# Patient Record
Sex: Female | Born: 1970 | Race: White | Hispanic: No | Marital: Single | Smoking: Never smoker
Health system: Southern US, Academic
[De-identification: ages and names within clinical notes are randomized; demographics above are authoritative.]

## PROBLEM LIST (undated history)

## (undated) DIAGNOSIS — H9319 Tinnitus, unspecified ear: Secondary | ICD-10-CM

## (undated) DIAGNOSIS — I10 Essential (primary) hypertension: Secondary | ICD-10-CM

## (undated) HISTORY — DX: Tinnitus, unspecified ear: H93.19

## (undated) HISTORY — DX: Essential (primary) hypertension: I10

---

## 2019-02-09 DIAGNOSIS — Z8669 Personal history of other diseases of the nervous system and sense organs: Secondary | ICD-10-CM | POA: Insufficient documentation

## 2019-02-09 DIAGNOSIS — R569 Unspecified convulsions: Secondary | ICD-10-CM | POA: Insufficient documentation

## 2020-01-18 DIAGNOSIS — E872 Acidosis, unspecified: Secondary | ICD-10-CM | POA: Insufficient documentation

## 2020-01-18 DIAGNOSIS — R9089 Other abnormal findings on diagnostic imaging of central nervous system: Secondary | ICD-10-CM | POA: Insufficient documentation

## 2020-01-18 DIAGNOSIS — G40209 Localization-related (focal) (partial) symptomatic epilepsy and epileptic syndromes with complex partial seizures, not intractable, without status epilepticus: Secondary | ICD-10-CM | POA: Insufficient documentation

## 2020-01-18 DIAGNOSIS — I671 Cerebral aneurysm, nonruptured: Secondary | ICD-10-CM | POA: Insufficient documentation

## 2020-01-18 DIAGNOSIS — E878 Other disorders of electrolyte and fluid balance, not elsewhere classified: Secondary | ICD-10-CM | POA: Insufficient documentation

## 2020-01-18 DIAGNOSIS — Z8679 Personal history of other diseases of the circulatory system: Secondary | ICD-10-CM | POA: Insufficient documentation

## 2020-05-05 DIAGNOSIS — I429 Cardiomyopathy, unspecified: Secondary | ICD-10-CM | POA: Insufficient documentation

## 2021-11-18 IMAGING — MR MRA HEAD WITHOUT CONTRAST
7 of 8 series · 27 of 48 positions shown · IV contrast (gadolinium)
Comparison: None available.

﻿EXAM:  30644   MRA HEAD WITHOUT CONTRAST
INDICATION: Seizures.
TECHNIQUE: MR angiogram of the circle-of-Willis was performed without gadolinium contrast utilizing 3D time-of-flight technique.

[Series 5: DWI · axial · 5.0mm · 1.35mm/px · z∈[-42,+84]mm · 9 of 88 slices shown (1 of 3)]
[im 1/88]
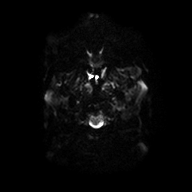
[im 11/88]
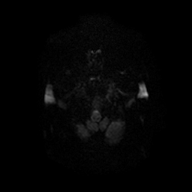
[im 22/88]
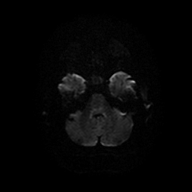
[im 33/88]
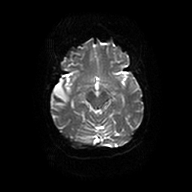
[im 44/88]
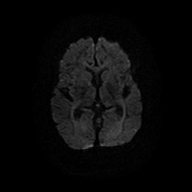
[im 55/88]
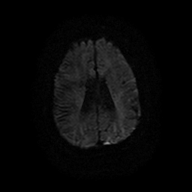
[im 66/88]
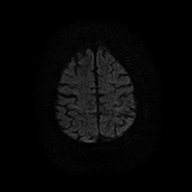
[im 77/88]
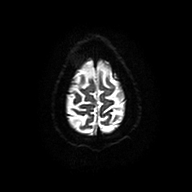
[im 88/88]
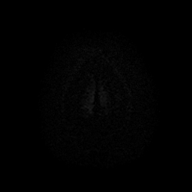

[Series 6: DWI · axial · 5.0mm · 1.35mm/px · z∈[-42,+84]mm · 3 of 22 slices shown (2 of 3)]
[im 1/22]
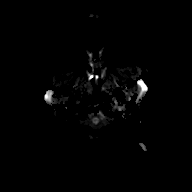
[im 11/22]
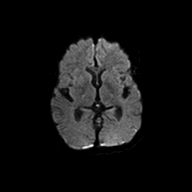
[im 22/22]
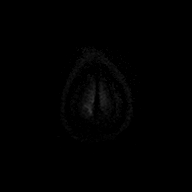

[Series 7: DWI · axial · 5.0mm · 1.35mm/px · z∈[-42,+84]mm · 3 of 22 slices shown (3 of 3)]
[im 1/22]
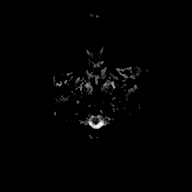
[im 11/22]
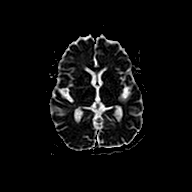
[im 22/22]
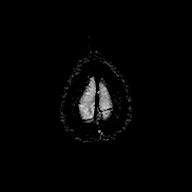

[Series 8: FLAIR · sagittal · 4.0mm · 0.75mm/px · 3 of 26 slices shown (1 of 2)]
[im 1/26]
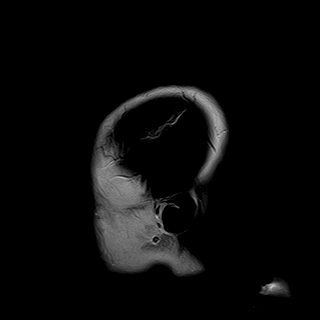
[im 13/26]
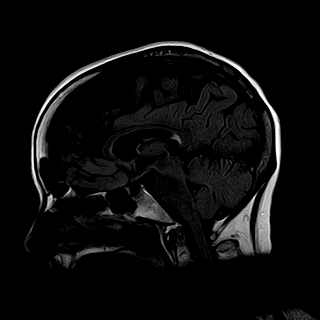
[im 26/26]
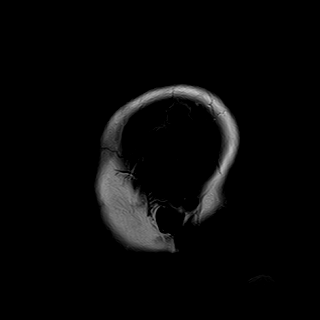

[Series 9: T2 · axial · 5.0mm · 0.43mm/px · z∈[-52,+92]mm · 3 of 25 slices shown]
[im 1/25]
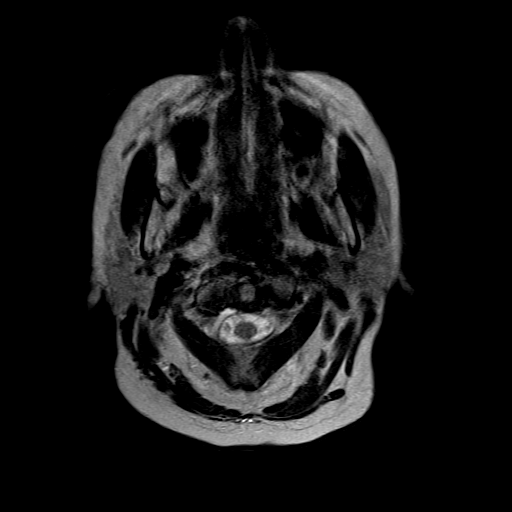
[im 13/25]
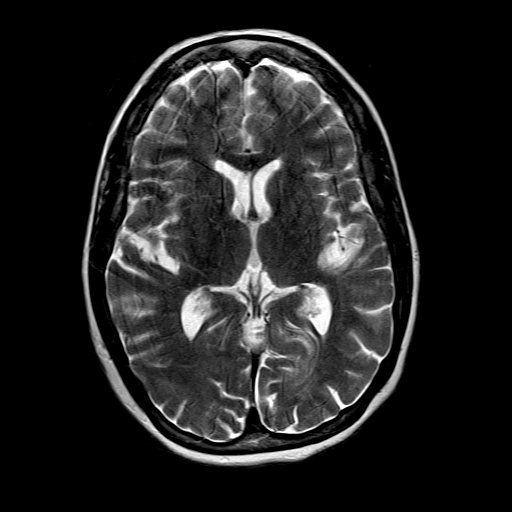
[im 25/25]
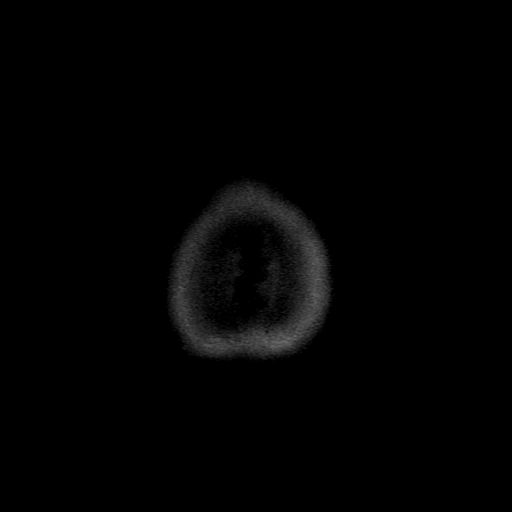

[Series 10: FLAIR · axial · 5.0mm · 0.43mm/px · z∈[-52,+92]mm · 3 of 25 slices shown (2 of 2)]
[im 1/25]
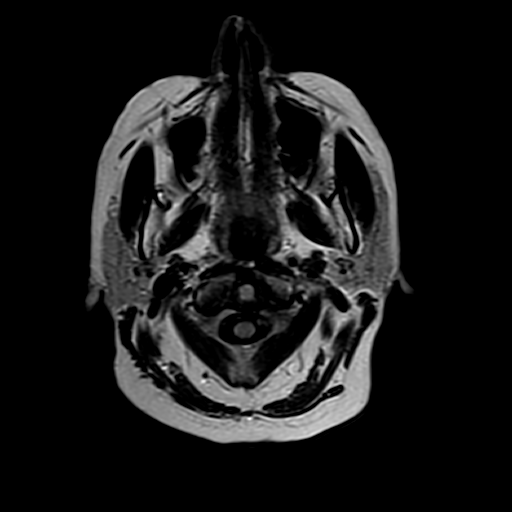
[im 13/25]
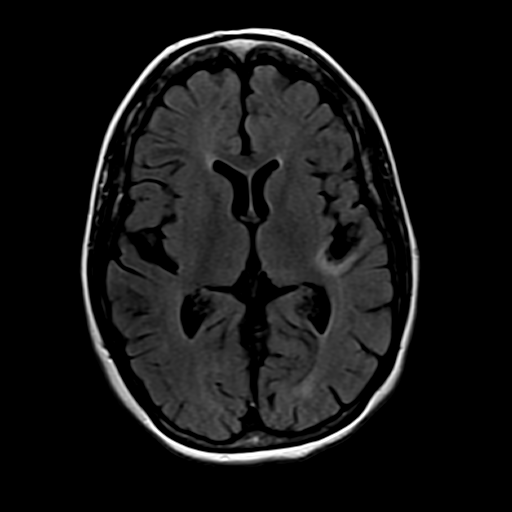
[im 25/25]
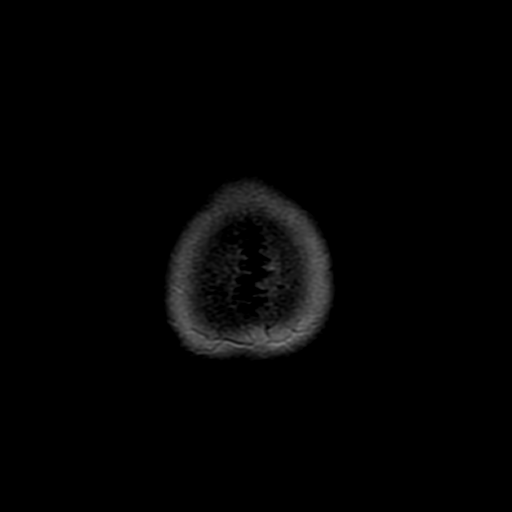

[Series 11: T1 · axial · 5.0mm · 0.43mm/px · z∈[-52,+92]mm · 3 of 25 slices shown]
[im 1/25]
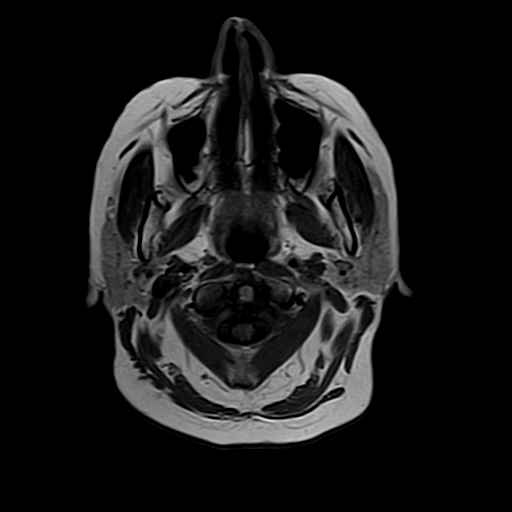
[im 13/25]
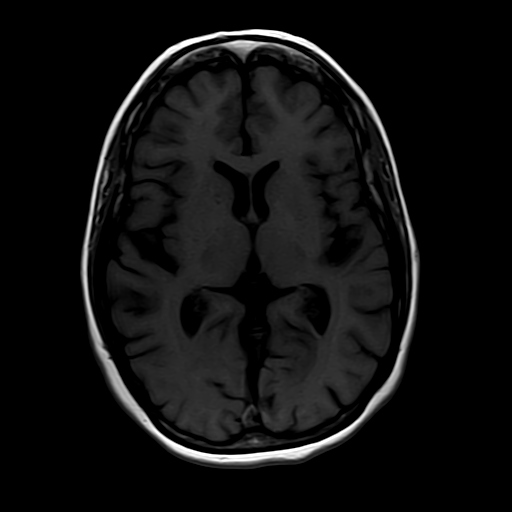
[im 25/25]
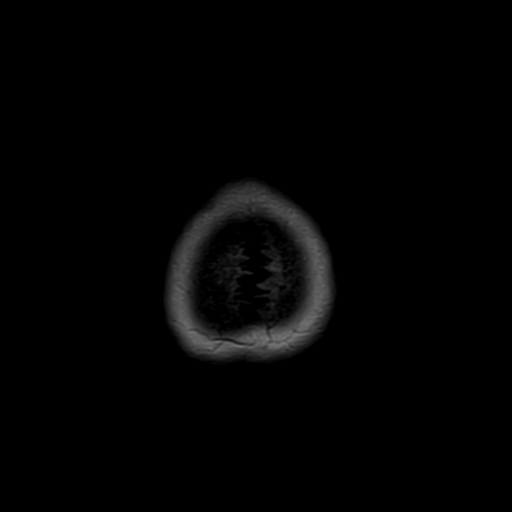

[27 of 48 positions shown; findings below may reference images not displayed]

FINDINGS: Major arteries of the circle-of-Willis are patent without hemodynamically significant stenosis, occlusion or aneurysm at any level.
IMPRESSION: Unremarkable exam.

## 2021-11-18 IMAGING — MR MRI BRAIN W/O CONTRAST
4 of 7 series · 24 of 48 positions shown · IV contrast (gadolinium)
Comparison: None available.

﻿EXAM:  MRI BRAIN W/O CONTRAST
INDICATION: Seizures.
TECHNIQUE: Multiplanar, multisequential MRI of the brain was performed without gadolinium contrast.

[Series 5: FLAIR · sagittal · 4.0mm · 0.75mm/px · 8 of 26 slices shown (1 of 2)]
[im 1/26]
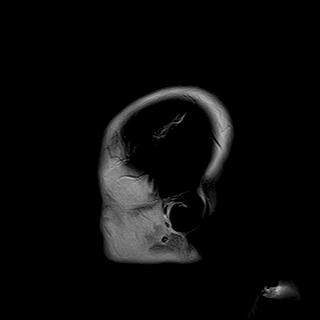
[im 4/26]
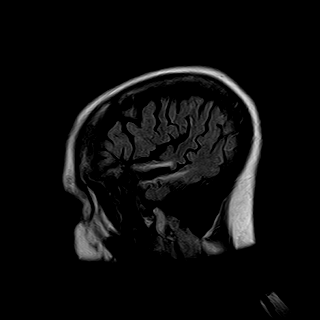
[im 8/26]
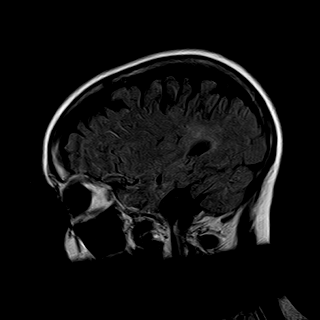
[im 11/26]
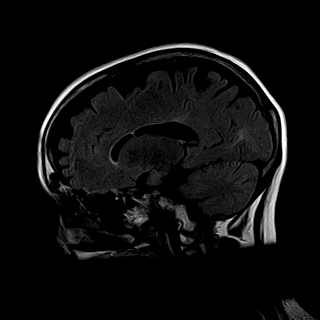
[im 15/26]
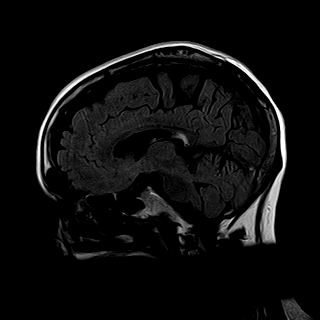
[im 18/26]
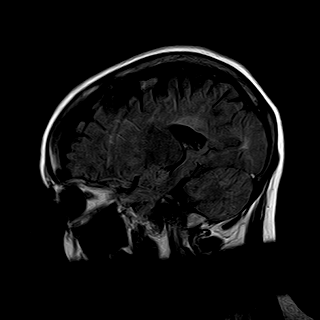
[im 22/26]
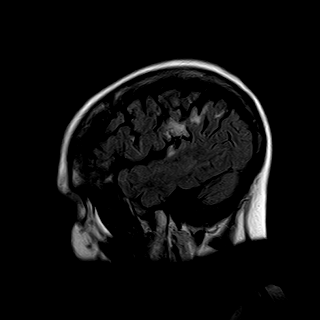
[im 26/26]
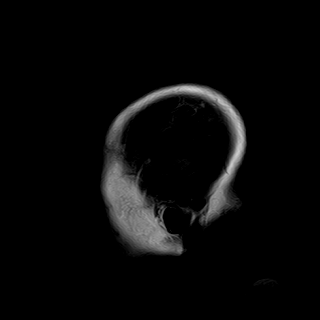

[Series 6: T2 · axial · 5.0mm · 0.43mm/px · z∈[-50,+70]mm · 6 of 25 slices shown]
[im 1/25]
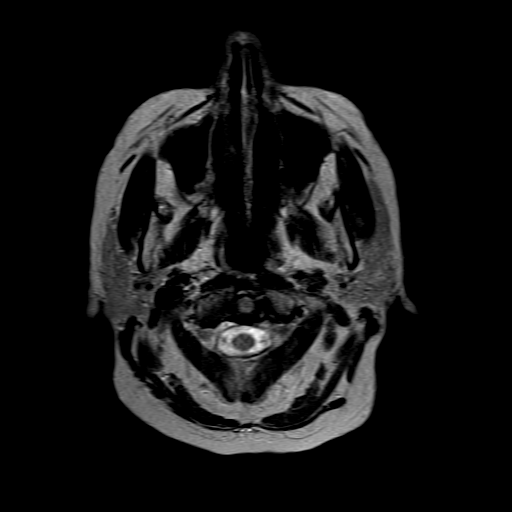
[im 5/25]
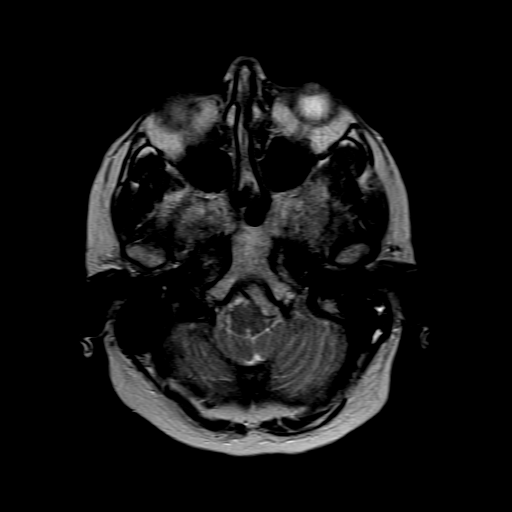
[im 9/25]
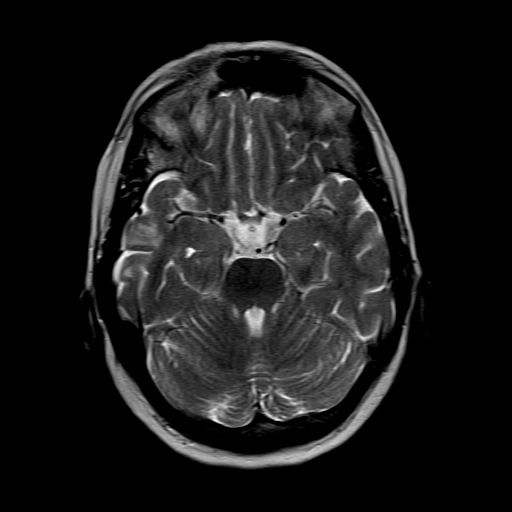
[im 13/25]
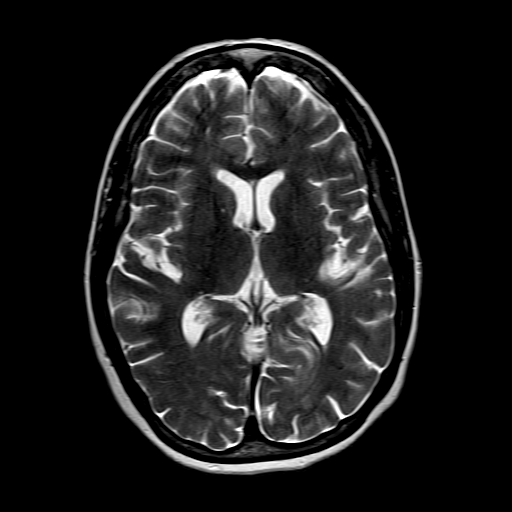
[im 17/25]
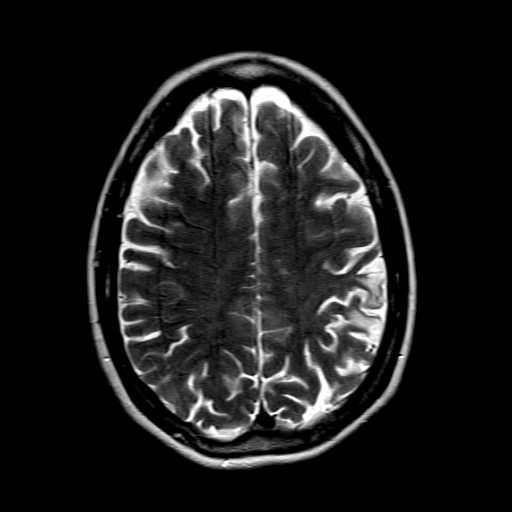
[im 21/25]
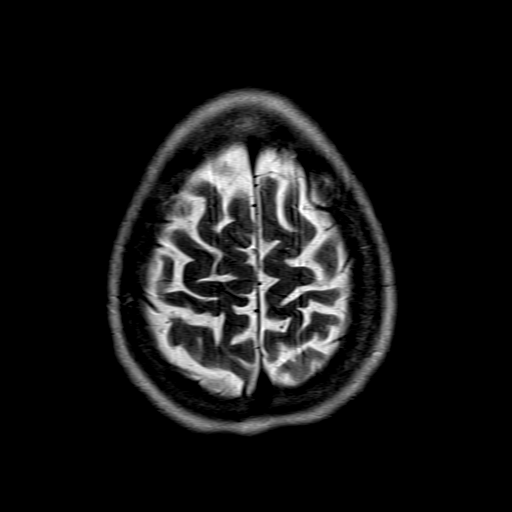

[Series 7: FLAIR · axial · 5.0mm · 0.43mm/px · z∈[-50,+94]mm · 7 of 25 slices shown (2 of 2)]
[im 1/25]
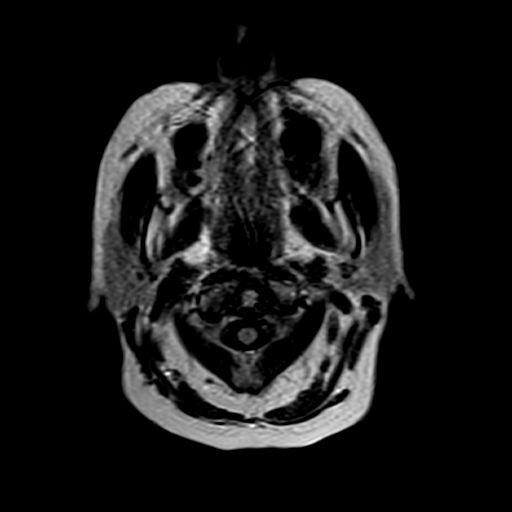
[im 5/25]
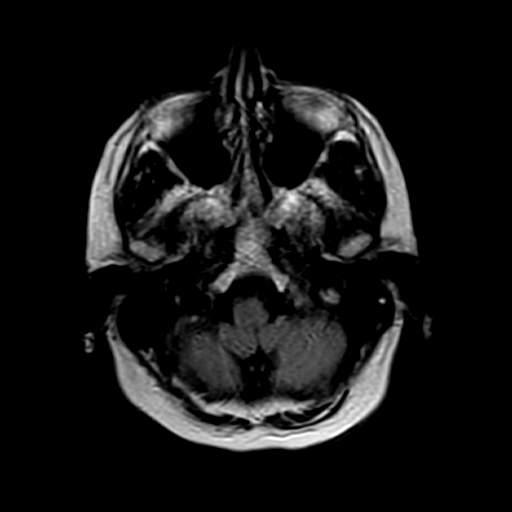
[im 9/25]
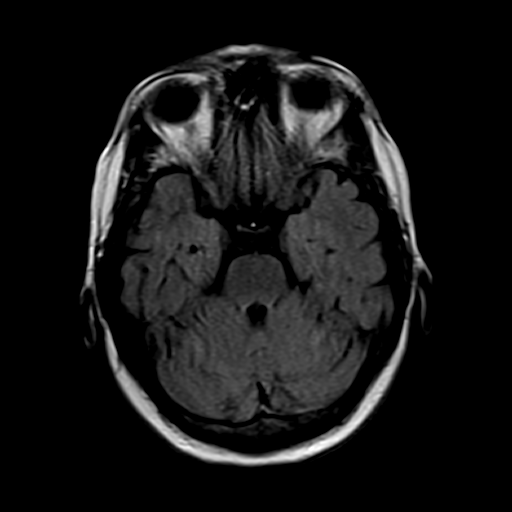
[im 13/25]
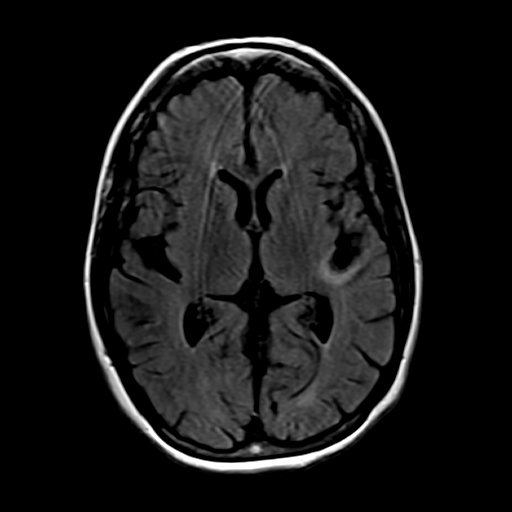
[im 17/25]
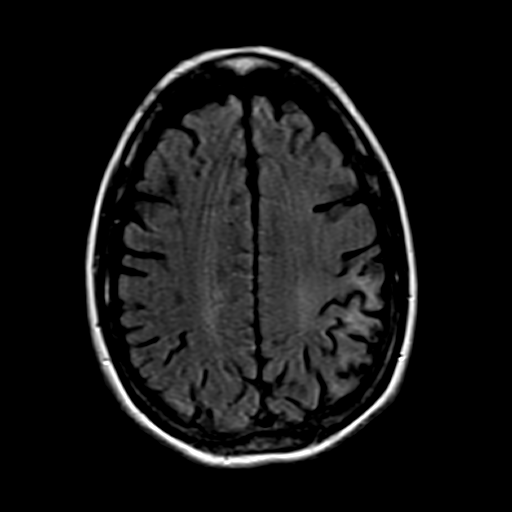
[im 21/25]
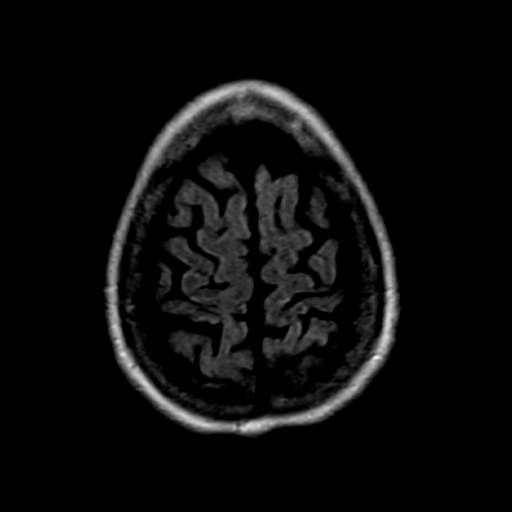
[im 25/25]
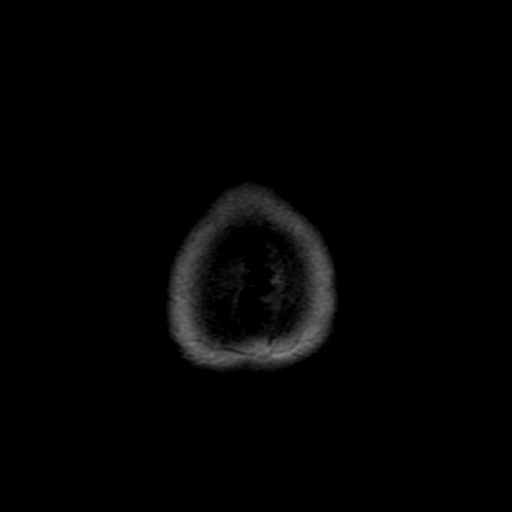

[Series 8: T1 · axial · 5.0mm · 0.43mm/px · z∈[-26,+70]mm · 3 of 25 slices shown]
[im 5/25]
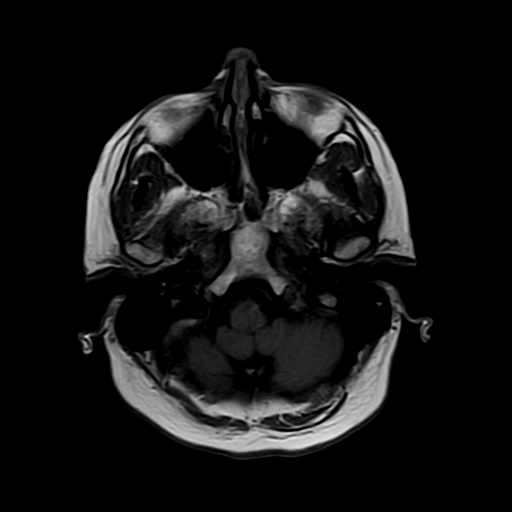
[im 13/25]
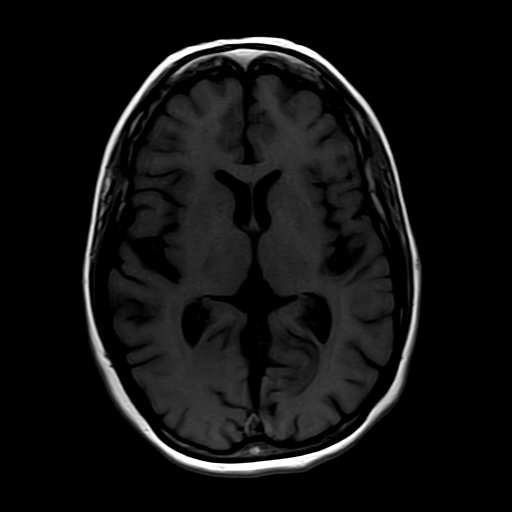
[im 21/25]
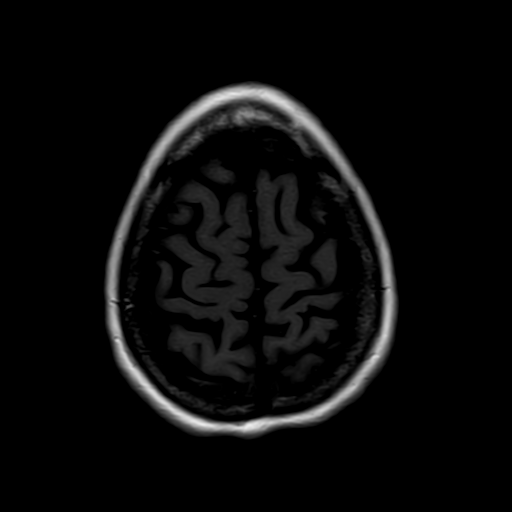

[24 of 48 positions shown; findings below may reference images not displayed]

FINDINGS: Ventricular and sulcal size is normal for the patient's age. There are small chronic bilateral temporal lobe infarcts. There is no mass effect, midline shift or intracranial hemorrhage. There is no evidence of acute infarction or prior microhemorrhages. Skull base flow voids and basal cisterns are patent. Sagittal survey of midline structures is unremarkable. There are no extra-axial fluid collections. There is partial opacification of the left mastoid air cells. Paranasal sinuses and orbital contents are unremarkable.
IMPRESSION: Small chronic bilateral temporal lobe infarcts. No acute intracranial abnormality.

## 2022-04-30 DIAGNOSIS — Z8679 Personal history of other diseases of the circulatory system: Secondary | ICD-10-CM | POA: Insufficient documentation

## 2022-05-20 IMAGING — CT CTA HEAD
2 of 3 series · 13 of 33 positions shown · IV contrast (CONTRAST)
Comparison: MR angiogram of the head dated 11/17/2021.

﻿EXAM:  CTA HEAD,CTA NECK
INDICATION: Chronic headaches.
TECHNIQUE: CT angiogram of the head and neck was performed following intravenous administration of 100 mL of Omnipaque 350. Additional volume rendered images were also obtained. Images were reviewed in multiple windows and projections. Exam was performed using 1 or more of the following dose reduction techniques: Automated exposure control, adjustment of the mA and/or kV according to patient size, or the use of iterative reconstruction technique.

[Series 8048: 1.5 head · axial · 0.49mm/px · z∈[+140,+248]mm · 12 of 86 slices shown]
[im 7/86  soft-tissue]
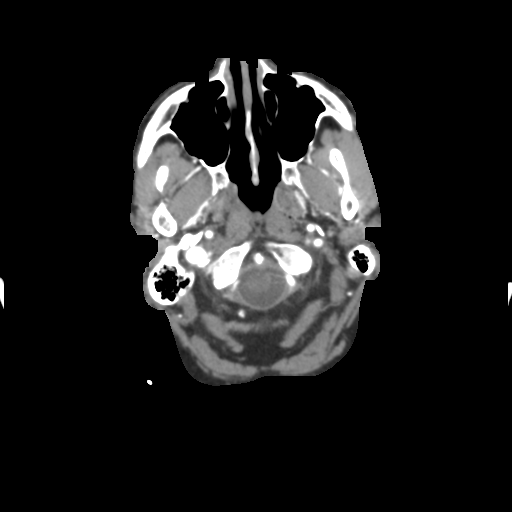
[im 14/86  bone]
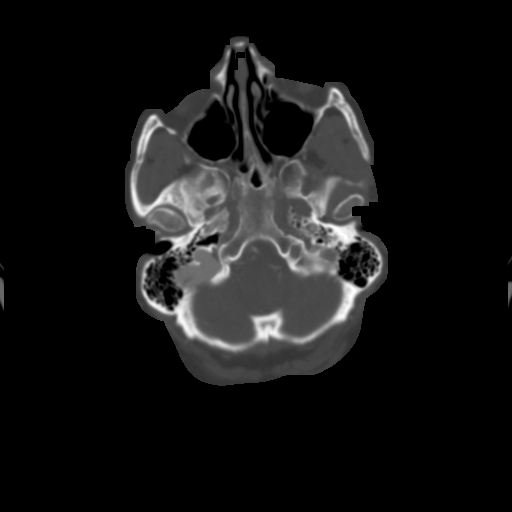
[im 20/86  soft-tissue]
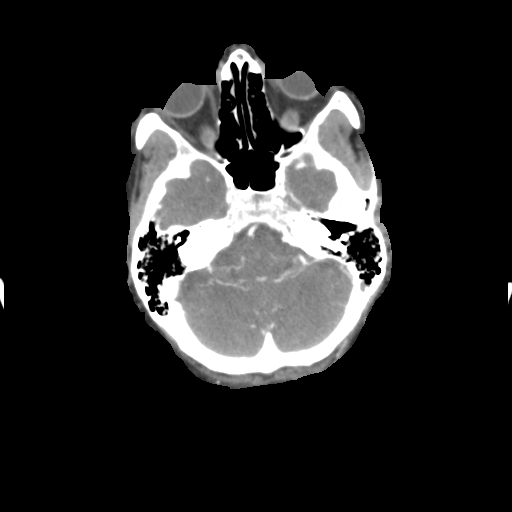
[im 27/86  bone]
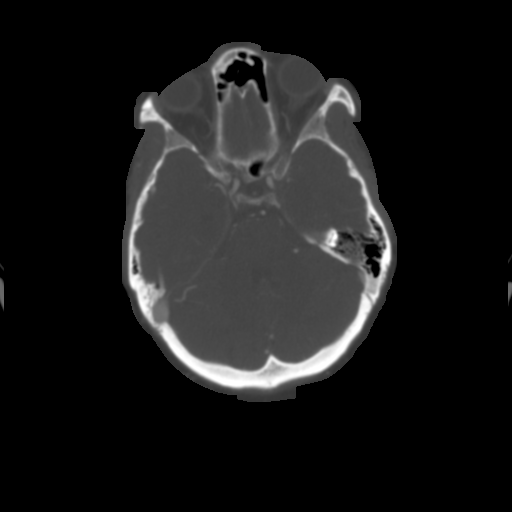
[im 33/86  soft-tissue]
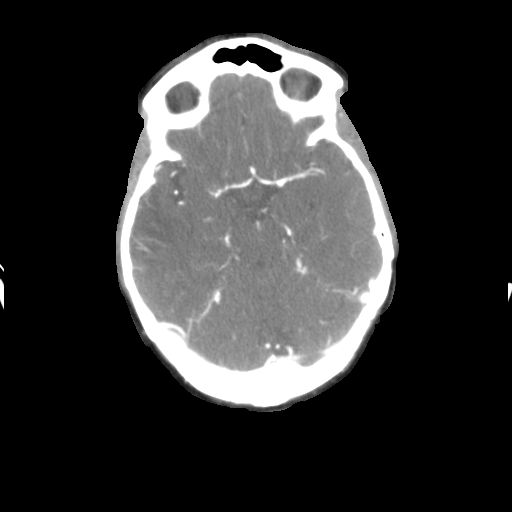
[im 40/86  bone]
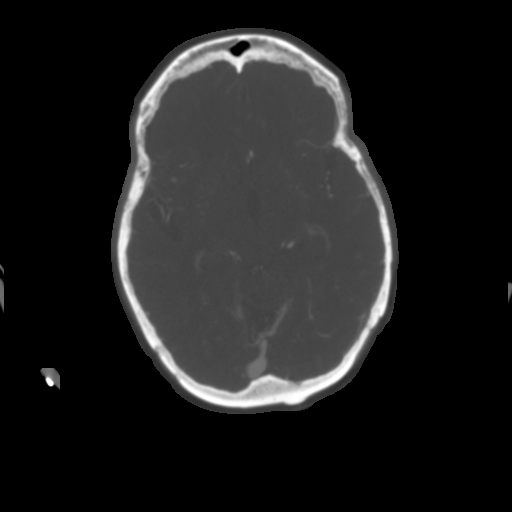
[im 46/86  soft-tissue]
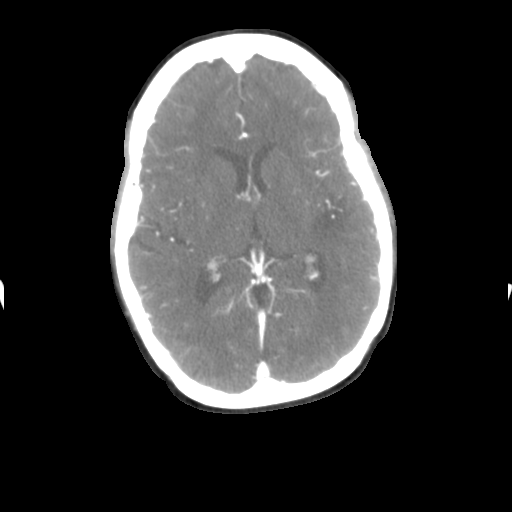
[im 53/86  bone]
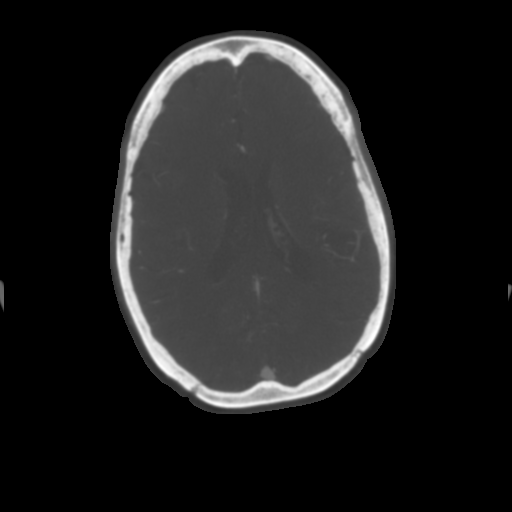
[im 59/86  soft-tissue]
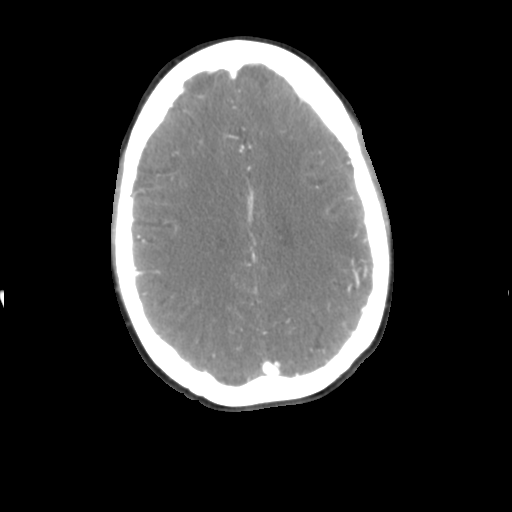
[im 66/86  bone]
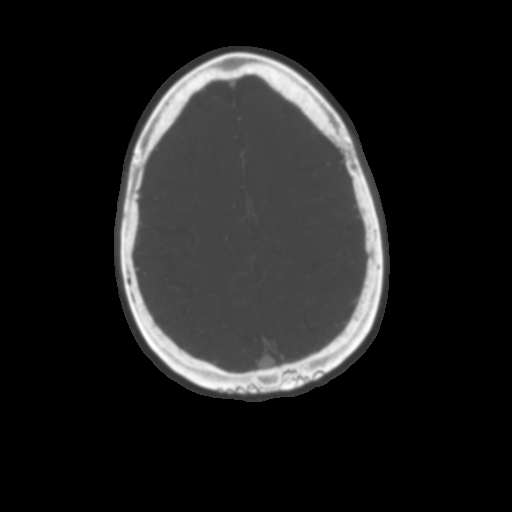
[im 72/86  soft-tissue]
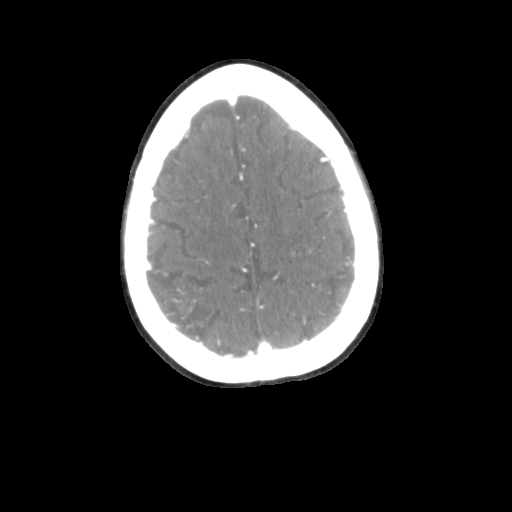
[im 79/86  bone]
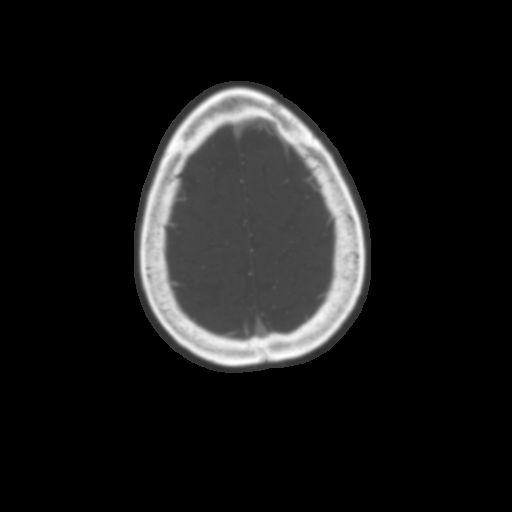

[sag · sagittal · 0.49mm/px · 1 of 76 slices shown]
[im 38/76  soft-tissue]
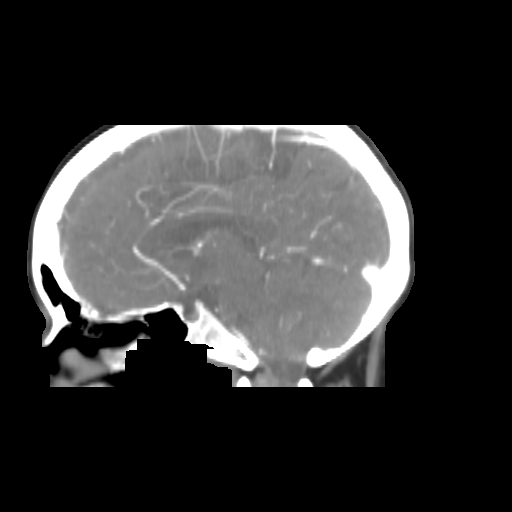

[13 of 33 positions shown; findings below may reference images not displayed]

FINDINGS: Neck 

There is a small amount of calcified atherosclerotic plaque within the carotid bulbs bilaterally.  No hemodynamically significant stenosis, occlusion or dissection is seen at any level within the carotid and vertebral arteries throughout the neck.  Right vertebral artery is congenitally diminutive in caliber.

Head

Major arteries of the circle-of-Willis are also patent without significant atherosclerotic disease, hemodynamically significant stenosis, occlusion or aneurysm at any level.
IMPRESSION: Patent major arteries of the head and neck.

## 2022-07-27 ENCOUNTER — Other Ambulatory Visit: Payer: Self-pay

## 2022-07-27 ENCOUNTER — Ambulatory Visit (INDEPENDENT_AMBULATORY_CARE_PROVIDER_SITE_OTHER): Payer: Medicare HMO | Admitting: OTOLARYNGOLOGY

## 2022-07-27 ENCOUNTER — Encounter (INDEPENDENT_AMBULATORY_CARE_PROVIDER_SITE_OTHER): Payer: Self-pay | Admitting: OTOLARYNGOLOGY

## 2022-07-27 VITALS — Ht 60.0 in | Wt 120.0 lb

## 2022-07-27 DIAGNOSIS — H9313 Tinnitus, bilateral: Secondary | ICD-10-CM

## 2022-07-27 DIAGNOSIS — H9202 Otalgia, left ear: Secondary | ICD-10-CM

## 2022-07-27 DIAGNOSIS — J342 Deviated nasal septum: Secondary | ICD-10-CM

## 2022-07-27 DIAGNOSIS — H9190 Unspecified hearing loss, unspecified ear: Secondary | ICD-10-CM

## 2022-07-27 NOTE — Procedures (Signed)
[facility], [facility]  [address]  [place] [address]    Procedure Note    Name: Angela Osborne MRN:  [id number]   Date: 07/27/2022 Age: 51 y.o.       [address]31 - NASAL ENDOSCOPY DIAGNOSTIC UNILATERAL OR BILATERAL (AMB ONLY)  Performed by: Conchita Paris, DO  Authorized by: Conchita Paris, DO     Time Out:     Immediately before the procedure, a time out was called:  Yes    Patient verified:  Yes    Procedure Verified:  Yes    Site Verified:  Yes  Documentation:      Indications for procedure: Otalgia    Anesthesia: Oxymetazoline nasal spray    Description: Nasal endoscopy with rigid scope was performed with examination of the  septum, inferior, middle, and superior meatus, turbinates, sphenoethmoidal recess, and nasopharynx.     There were no polyps, pus, or granulation tissue noted.  ET orifices and nasopharynx were normal.     Findings: Septal deviation, no masses or lesion    The patient tolerated the procedure well.             Conchita Paris, DO

## 2022-07-27 NOTE — H&P (Signed)
[facility], [facility]  [address]  [place] [address]  Phone: [phone]  Fax: [phone]      Encounter Date: 07/27/2022    Patient ID: Angela Osborne  MRN: [id number]    DOB: 01-29-1971  Age: 51 y.o. female        Referring Provider:    Dorinda Hill, MD  [address]  [address] [place],  [place] [address]    Reason for Visit:   Chief Complaint   Patient presents with    Ringing in Ear     Complains of tinnitus for the past several years. States it has gotten worse.        History of Present Illness:  Angela Osborne is a 51 y.o. female who is referred for eval of ears.   She c/o tinnitus, left side sounds like cicadas and right sounds electrical, x 3+ years. Endorses loud noise exposure. Denies h/o ear infections, vertigo.  She c/o gradual hearing loss and interm left otalgia and otorrhea.. She has had Brain imaging d/t 6 year h/o seizure d/o, h/o stroke and brain aneurysm-- sees Dr. Andee Poles.   Tympanogram: AU Type A      Patient History:  Patient Active Problem List   Diagnosis    Brain aneurysm    Cardiomyopathy (CMS HCC)    Electrolyte disorder    History of cerebral aneurysm    History of hypertension    History of seizure disorder    Magnetic resonance imaging of brain abnormal    Metabolic acidosis    Non-intractable psychomotor epilepsy (CMS HCC)    Seizure-like activity (CMS HCC)     Current Outpatient Medications   Medication Sig    aspirin 81 mg Oral Capsule Every one hour    lacosamide (VIMPAT) 50 mg Oral Tablet TAKE 1 TABLET by mouth TWICE DAILY FOR ONE WEEK ,then take 2 tabs in the morning and 1 tab in the evening for 1 week, then fill 100mg  prescription    lisinopriL (PRINIVIL) 5 mg Oral Tablet lisinopril 5 mg tablet    zonisamide (ZONEGRAN) 100 mg Oral Capsule Take 1 Capsule (100 mg total) by mouth Twice daily     No Known Allergies  Past Medical History:   Diagnosis Date    Essential hypertension     Tinnitus       History reviewed. No pertinent surgical history.   Family Medical  History:    None         Social History     Tobacco Use    Smoking status: Never    Smokeless tobacco: Never       Review of Systems:  Review of Systems    Physical Exam:  Ht 1.524 m (5')   Wt 54.4 kg (120 lb)   BMI 23.44 kg/m       Physical Exam  Constitutional:       Appearance: Normal appearance. She is well-developed, well-groomed and normal weight.   HENT:      Head: Normocephalic and atraumatic.      Right Ear: Hearing, tympanic membrane, ear canal and external ear normal.      Left Ear: Hearing, tympanic membrane, ear canal and external ear normal.      Nose: Septal deviation and mucosal edema present.      Right Turbinates: Enlarged.      Left Turbinates: Enlarged.      Mouth/Throat:      Lips: Pink.  Mouth: Mucous membranes are moist.      Pharynx: Oropharynx is clear. Uvula midline.      Tonsils: 0 on the right. 0 on the left.      Comments: Large torus mandibulari bilat  Eyes:      Extraocular Movements: Extraocular movements intact.   Neck:      Trachea: Phonation normal.   Pulmonary:      Effort: Pulmonary effort is normal.   Musculoskeletal:      Cervical back: Normal range of motion and neck supple.   Lymphadenopathy:      Cervical: No cervical adenopathy.   Skin:     General: Skin is warm.   Neurological:      Mental Status: She is alert and oriented to person, place, and time.      Cranial Nerves: Cranial nerves 2-12 are intact. No facial asymmetry.   Psychiatric:         Attention and Perception: Attention normal.         Mood and Affect: Mood normal.         Speech: Speech normal.         Behavior: Behavior normal. Behavior is cooperative.       Assessment:  ENCOUNTER DIAGNOSES     ICD-10-CM   1. Tinnitus of both ears  H93.13   2. Hearing loss, unspecified hearing loss type, unspecified laterality  H91.90   3. Otalgia of left ear  H92.02       Plan:  Medical records reviewed on 07/27/2022.  Will order audiogram.   Orders Placed This Encounter    [phone] - NASAL ENDOSCOPY DIAGNOSTIC UNILATERAL  OR BILATERAL (AMB ONLY)    AMB PRN REFERRAL EXTERNAL AUDIOLOGIST    POCT HEARING/VISION/TYMPANOGRAM (AMB ONLY)     Return for Follow up after audiogram.    Marcelline Deist, PA-C  07/27/2022, 08:56   The advanced practice clinician's documentation was reviewed/amended in its entirety with the assessment and plan portion completely performed independently by me during this separate encounter.

## 2022-09-28 ENCOUNTER — Encounter (INDEPENDENT_AMBULATORY_CARE_PROVIDER_SITE_OTHER): Payer: Self-pay | Admitting: OTOLARYNGOLOGY

## 2022-09-28 ENCOUNTER — Other Ambulatory Visit: Payer: Self-pay

## 2022-09-28 ENCOUNTER — Ambulatory Visit (INDEPENDENT_AMBULATORY_CARE_PROVIDER_SITE_OTHER): Payer: Medicare HMO | Admitting: OTOLARYNGOLOGY

## 2022-09-28 VITALS — Ht 60.0 in | Wt 120.0 lb

## 2022-09-28 DIAGNOSIS — H9202 Otalgia, left ear: Secondary | ICD-10-CM

## 2022-09-28 DIAGNOSIS — H9190 Unspecified hearing loss, unspecified ear: Secondary | ICD-10-CM

## 2022-09-28 DIAGNOSIS — H9313 Tinnitus, bilateral: Secondary | ICD-10-CM

## 2022-09-28 NOTE — H&P (Signed)
[facility] [place]    Progress Note    Name: Angela Osborne MRN:  [id number]   Date: 09/28/2022 Age: 51 y.o.          Follow Up      Subjective:   Chief Complaint:   Follow-up After Testing (Rc after audio)       History of Present Illness:  Angela Osborne is a 51 y.o. old female who presents to the clinic for follow-up after audiogram.  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  She is still having tinnitus.  She is using some masking.    Review of Systems     Physical Exam:     Vitals:    09/28/22 0933   Weight: 54.4 kg (120 lb)   Height: 1.524 m (5')   BMI: 23.48      ENT Physical Exam  Constitutional  Appearance: patient appears well-developed, well-nourished and well-groomed,  Communication/Voice: communication appropriate for developmental age; vocal quality normal;  Head and Face  Appearance: head appears normal, face appears normal and face appears atraumatic;  Palpation: facial palpation normal;  Salivary: glands normal;  Ear  Hearing: intact;  Auricles: right auricle normal; left auricle normal;  External Mastoids: right external mastoid normal; left external mastoid normal;  Ear Canals: right ear canal normal; left ear canal normal;  Tympanic Membranes: right tympanic membrane normal; left tympanic membrane normal;  Nose  External Nose: nares patent bilaterally; external nose normal;  Internal Nose: nasal mucosa normal; septum normal; bilateral inferior turbinates normal;  Oral Cavity/Oropharynx  Lips: normal;  Teeth: normal;  Gums: gingiva normal;  Tongue: normal;  Oral mucosa: normal;  Hard palate: normal;  Neck  Neck: neck normal; neck palpation normal;  Thyroid: thyroid normal;  Respiratory  Inspection: breathing unlabored; normal breathing rate;  Lymphatic  Palpation: lymph nodes normal;  Neurovestibular  Mental Status: alert and oriented;  Psychiatric: mood normal; affect is appropriate;  Cranial Nerves: cranial nerves intact;       Assessment and Plan:       ICD-10-CM    1.  Tinnitus of both ears  H93.13       2. Hearing loss, unspecified hearing loss type, unspecified laterality  H91.90       3. Otalgia of left ear  H92.02         Audiogram interpreted  Masking techniques discussed  Patient is thinking about hearing aids.      Follow up:  Return in about 6 months (around 03/30/2023).    Dia Sitter, DO

## 2023-02-15 IMAGING — MR MRI BRAIN W/O CONTRAST
10 series · 42 of 48 positions shown · non-contrast
Comparison: November 18, 2021

﻿EXAM:  MRI BRAIN W/O CONTRAST
INDICATION: Other cerebrovascular disease.  Unspecified convulsions. Cerebral aneurysm, unruptured.  Tinnitus, bilateral.
TECHNIQUE: Noncontrast multiplanar, multisequence MRI was performed.

[Series 4: s-map · sagittal · 9.4mm · 4.69mm/px · 8 of 64 slices shown]
[im 1/64]
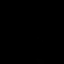
[im 8/64]
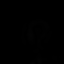
[im 22/64]
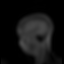
[im 29/64]
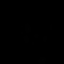
[im 36/64]
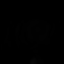
[im 43/64]
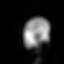
[im 57/64]
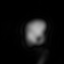
[im 64/64]
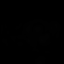

[Series 5: DWI · axial · 5.0mm · 1.35mm/px · z∈[-43,+83]mm · 9 of 88 slices shown (1 of 3)]
[im 1/88]
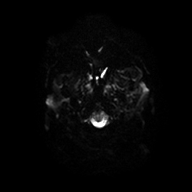
[im 15/88]
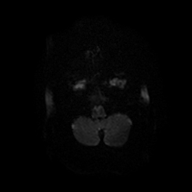
[im 30/88]
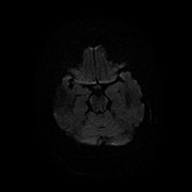
[im 37/88]
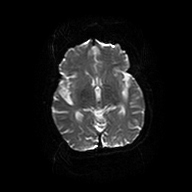
[im 44/88]
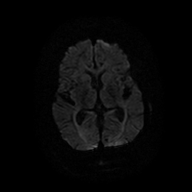
[im 51/88]
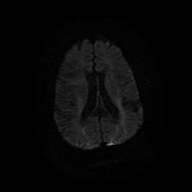
[im 59/88]
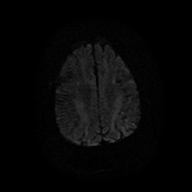
[im 73/88]
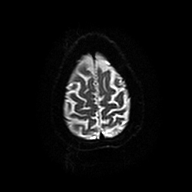
[im 88/88]
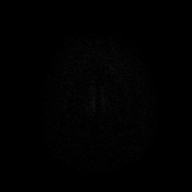

[Series 6: DWI · axial · 5.0mm · 1.35mm/px · z∈[-43,+83]mm · 3 of 22 slices shown (2 of 3)]
[im 1/22]
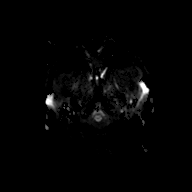
[im 11/22]
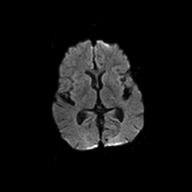
[im 22/22]
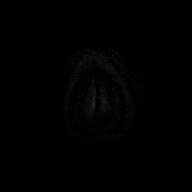

[Series 7: DWI · axial · 5.0mm · 1.35mm/px · z∈[-43,+83]mm · 3 of 22 slices shown (3 of 3)]
[im 1/22]
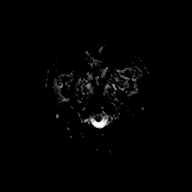
[im 11/22]
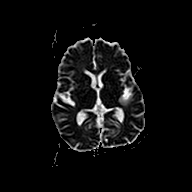
[im 22/22]
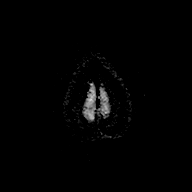

[Series 8: FLAIR · sagittal · 4.0mm · 0.75mm/px · 4 of 26 slices shown (1 of 2)]
[im 1/26]
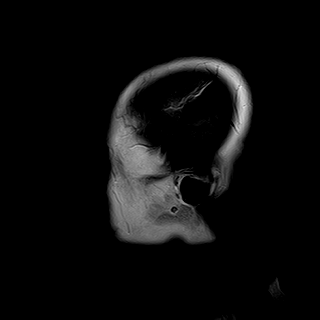
[im 9/26]
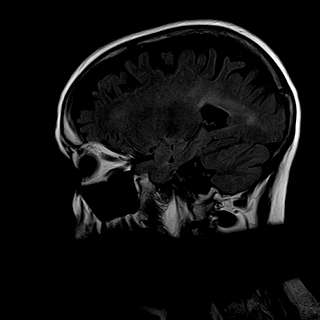
[im 17/26]
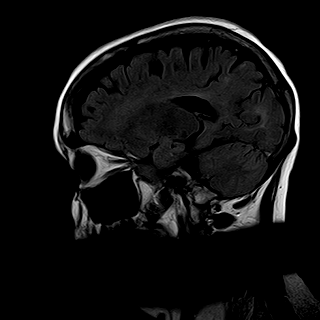
[im 26/26]
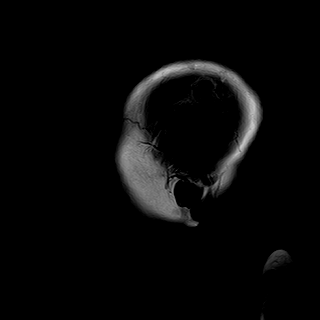

[Series 9: T2 · axial · 5.0mm · 0.43mm/px · z∈[-56,+88]mm · 3 of 25 slices shown (1 of 2)]
[im 1/25]
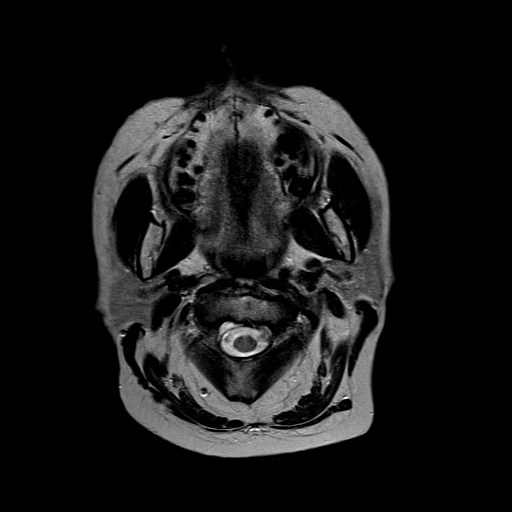
[im 13/25]
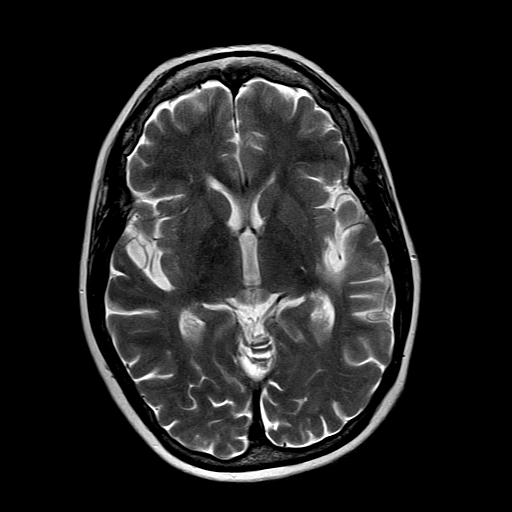
[im 25/25]
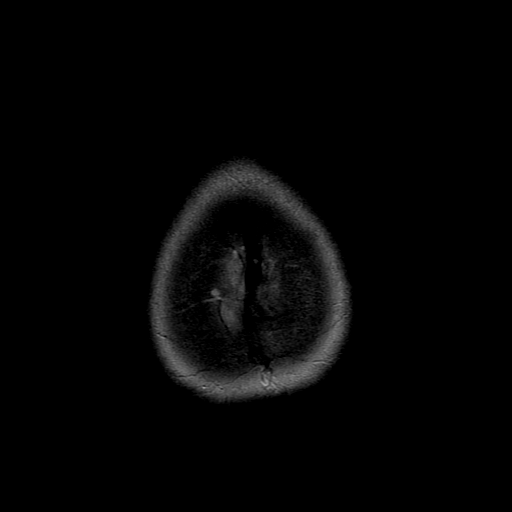

[Series 10: FLAIR · axial · 5.0mm · 0.43mm/px · z∈[-56,+88]mm · 3 of 25 slices shown (2 of 2)]
[im 1/25]
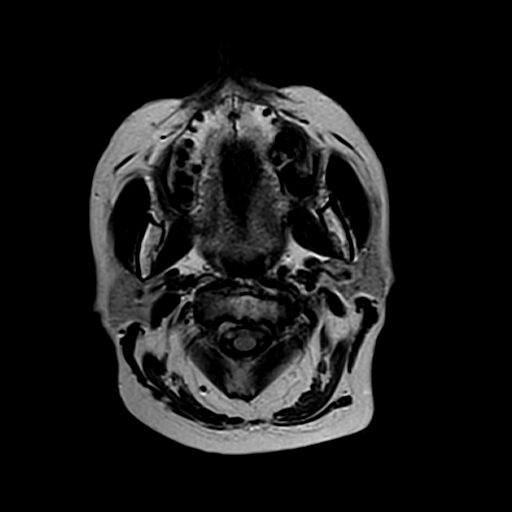
[im 13/25]
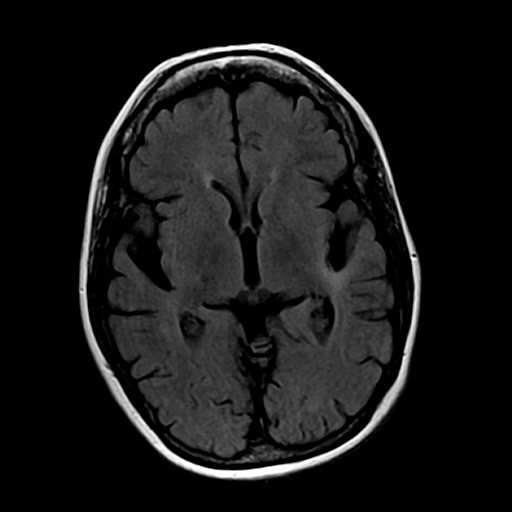
[im 25/25]
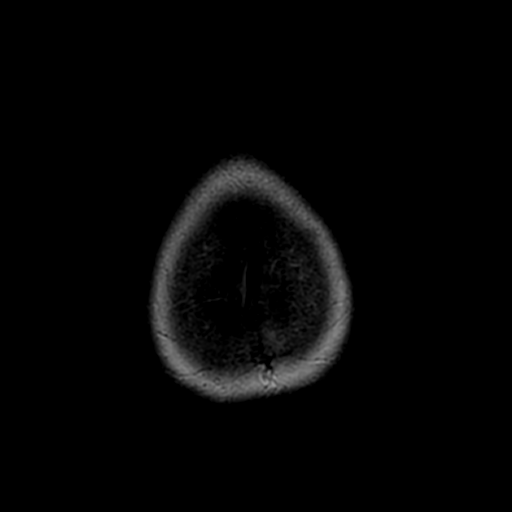

[Series 11: T1 · axial · 5.0mm · 0.43mm/px · z∈[-56,+88]mm · 3 of 25 slices shown]
[im 1/25]
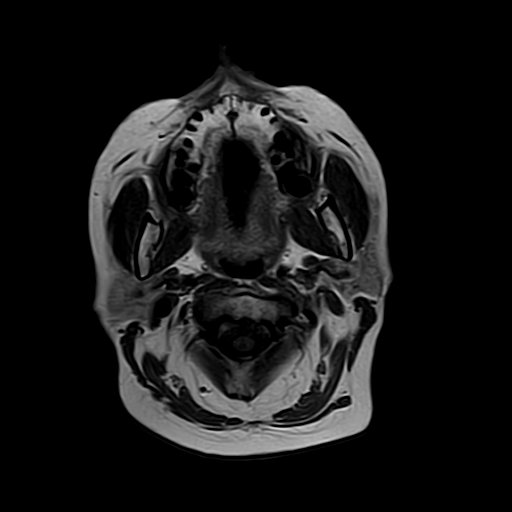
[im 13/25]
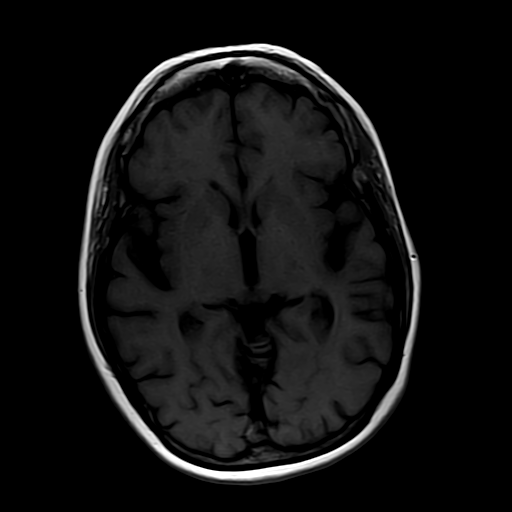
[im 25/25]
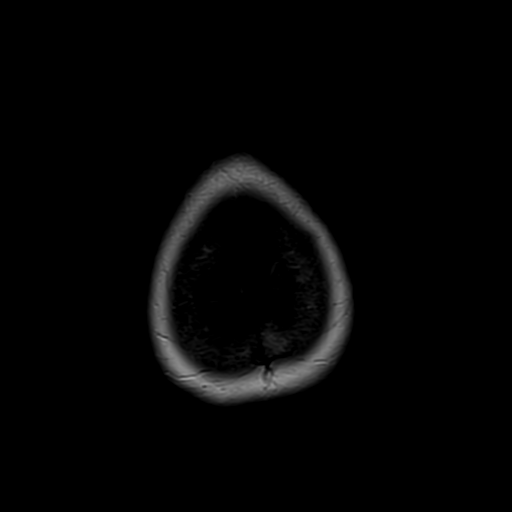

[Series 12: T2-star · axial · 5.0mm · 0.43mm/px · z∈[-56,+88]mm · 3 of 25 slices shown]
[im 1/25]
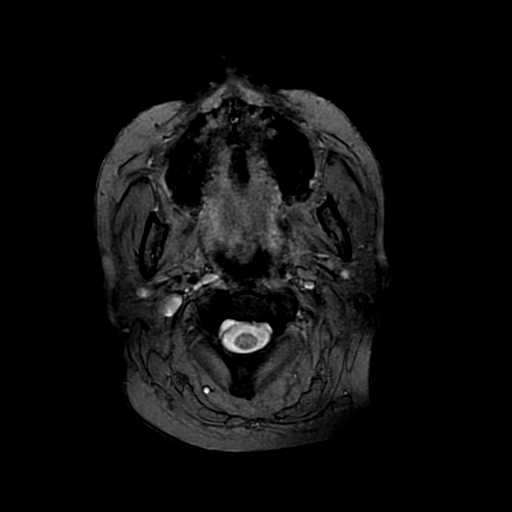
[im 13/25]
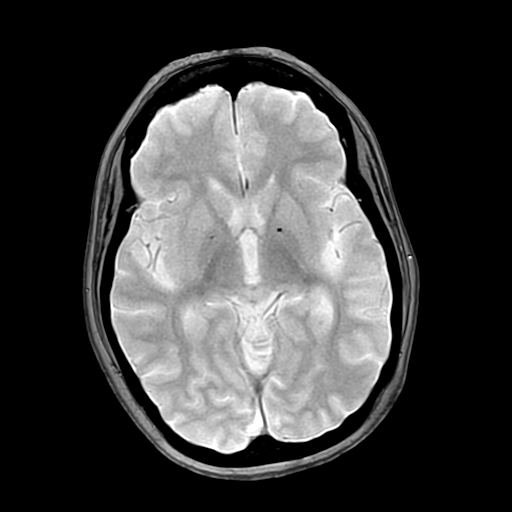
[im 25/25]
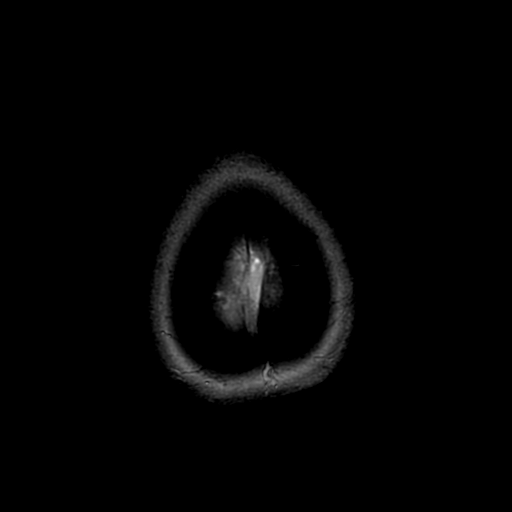

[Series 13: T2 · coronal · 6.2mm · 0.43mm/px · 3 of 24 slices shown (2 of 2)]
[im 1/24]
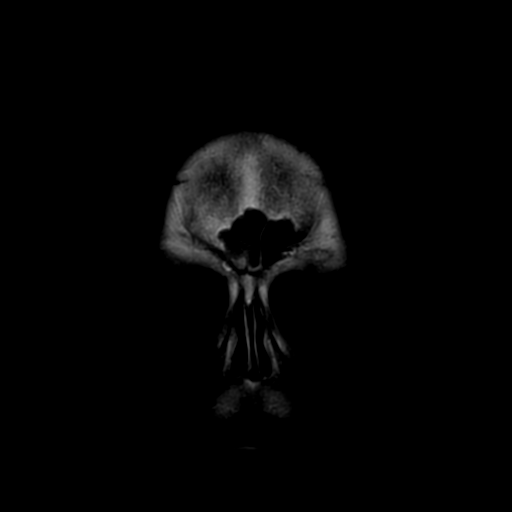
[im 12/24]
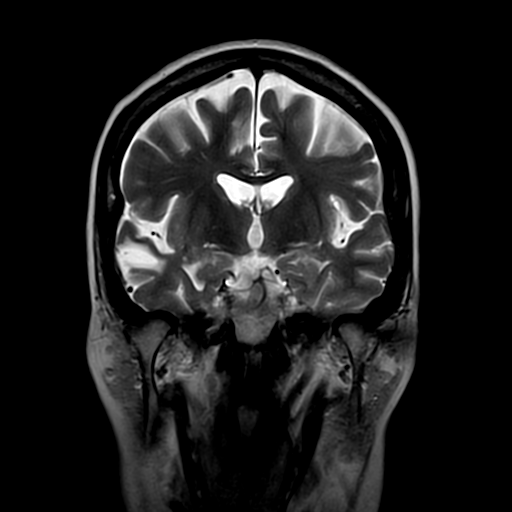
[im 24/24]
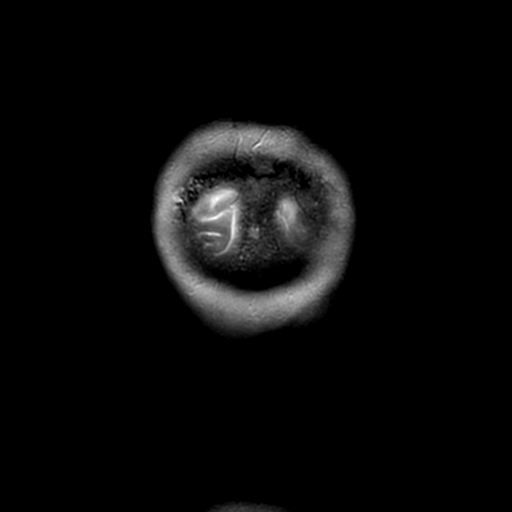

[42 of 48 positions shown; findings below may reference images not displayed]

FINDINGS: There is prominence of the ventricles and CSF spaces compatible with mild atrophy.

There is an old infarct in the right posterior temporal lobe and another old infarct in the left parietal lobe.  These appear unchanged compared to the prior exam dated November 18, 2021.  

No new infarct is seen.  There is no mass effect, acute hemorrhage, shift of the midline structures, or extra-axial collection.  

There is a small amount of fluid in the left mastoid air cells.  The paranasal sinuses appear clear.
IMPRESSION: No significant change compared to November 18, 2021.

## 2023-09-01 IMAGING — MR MRI CERVICAL SPINE WITHOUT CONTRAST
4 of 6 series · 25 of 48 positions shown · IV contrast (gadolinium)
Comparison: CT neck dated 05/20/2022.

﻿EXAM:  [id number]   MRI CERVICAL SPINE WITHOUT CONTRAST
INDICATION: Paresthesia, abnormal sensation right arm.  Cervical myelopathy.  History of spinal fusion.
TECHNIQUE: Multiplanar multisequential MRI of the C-spine was performed without gadolinium contrast.

[Series 5: T2 · sagittal · 3.0mm · 0.75mm/px · 7 of 13 slices shown (1 of 3)]
[im 1/13]
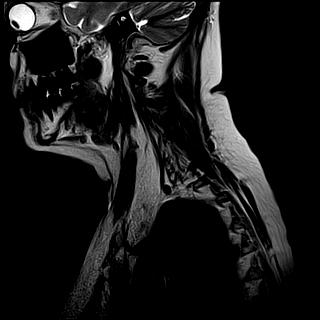
[im 3/13]
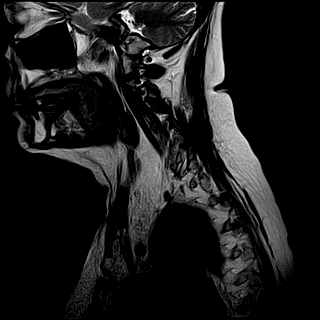
[im 5/13]
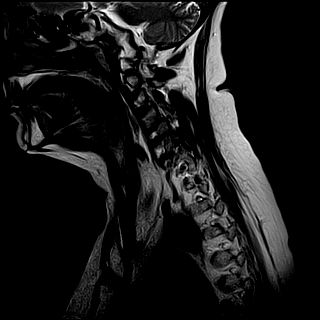
[im 7/13]
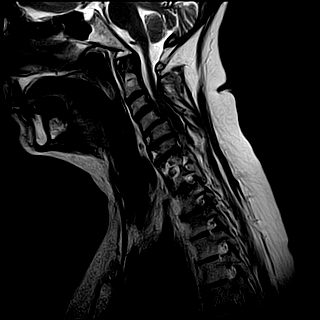
[im 9/13]
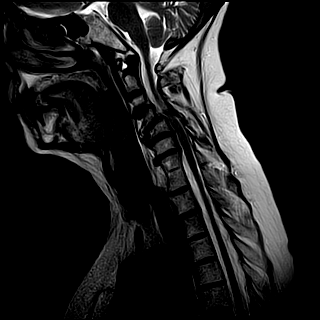
[im 11/13]
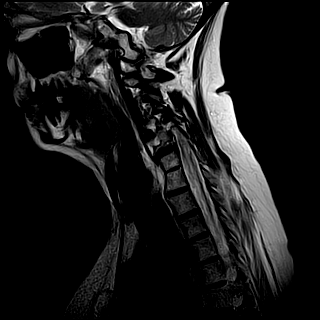
[im 13/13]
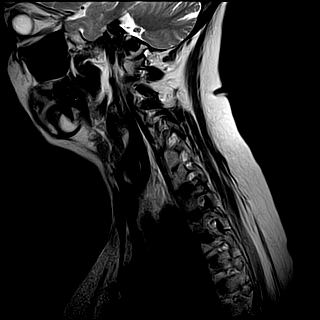

[Series 6: T1 · sagittal · 3.0mm · 0.47mm/px · 3 of 13 slices shown]
[im 3/13]
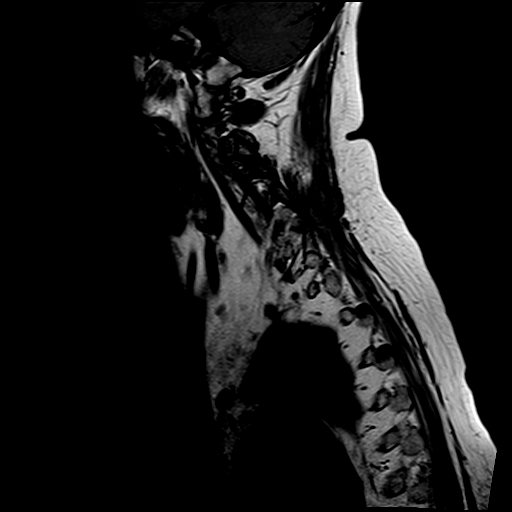
[im 7/13]
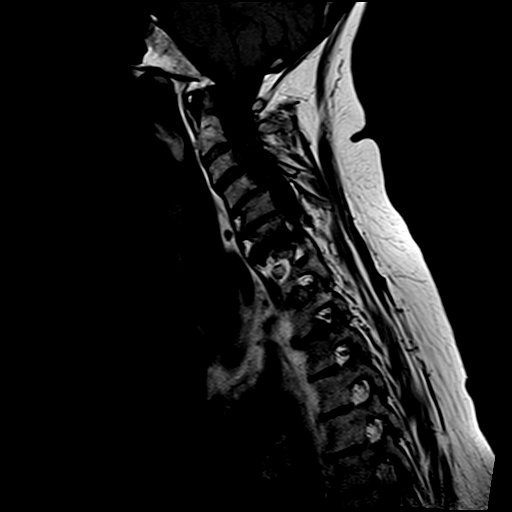
[im 11/13]
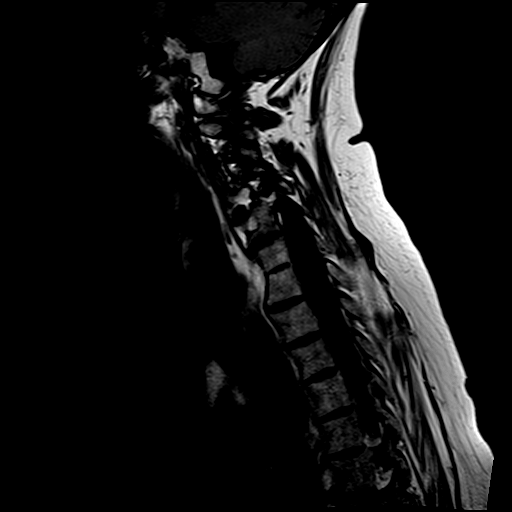

[Series 16: T2 · sagittal · 3.0mm · 0.75mm/px · 6 of 13 slices shown (2 of 3)]
[im 1/13]
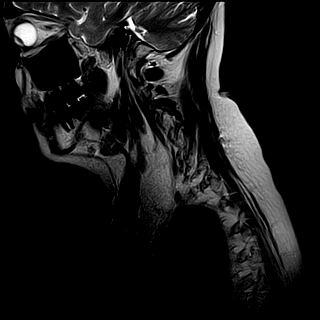
[im 3/13]
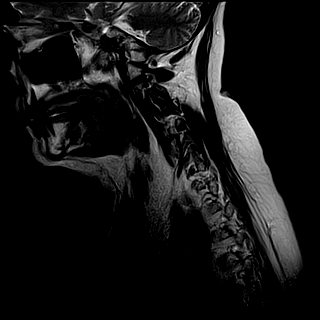
[im 5/13]
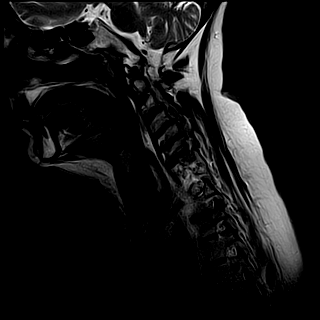
[im 8/13]
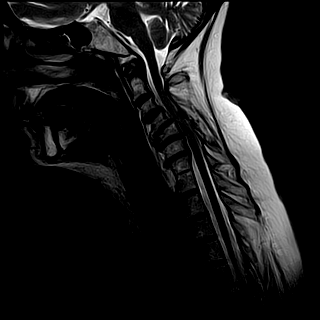
[im 10/13]
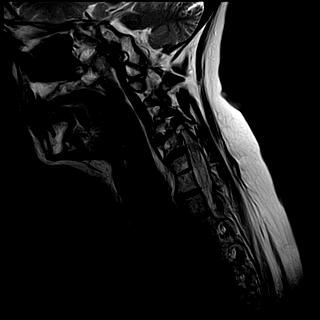
[im 13/13]
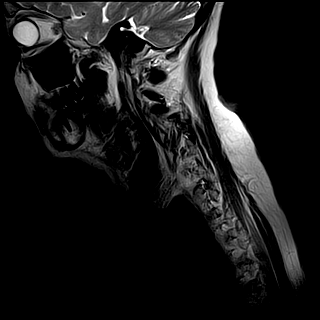

[Series 17: T2 · axial · 3.0mm · 0.39mm/px · z∈[-94,-32]mm · 9 of 23 slices shown (3 of 3)]
[im 1/23]
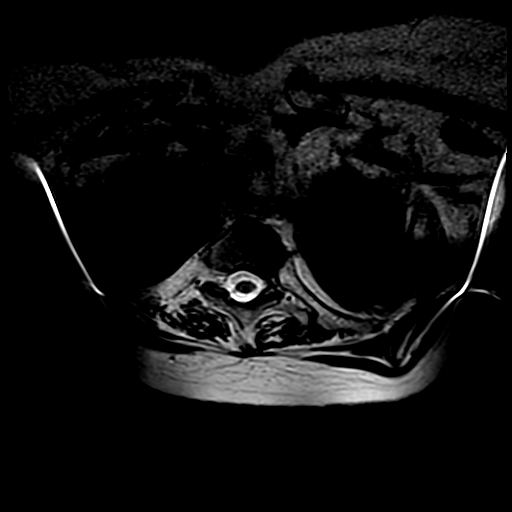
[im 3/23]
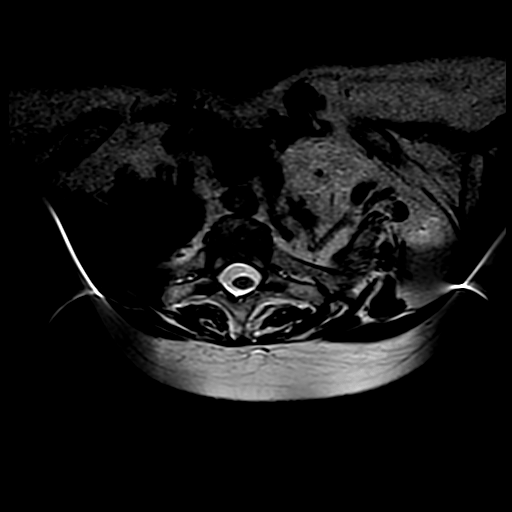
[im 5/23]
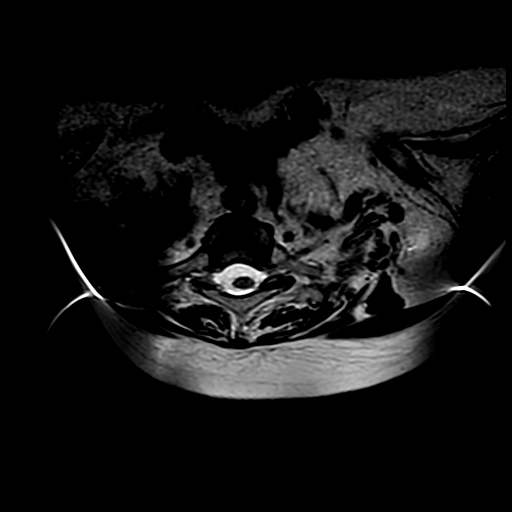
[im 7/23]
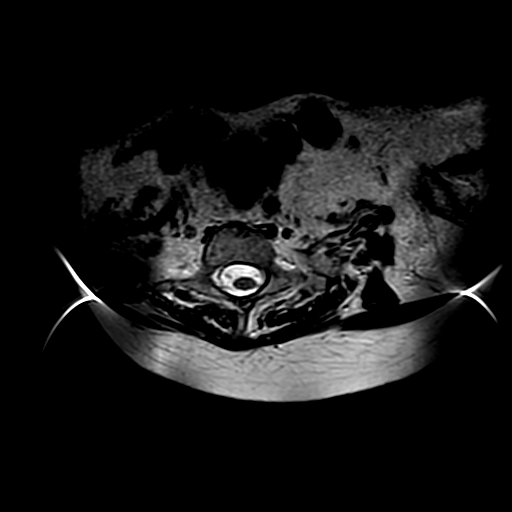
[im 9/23]
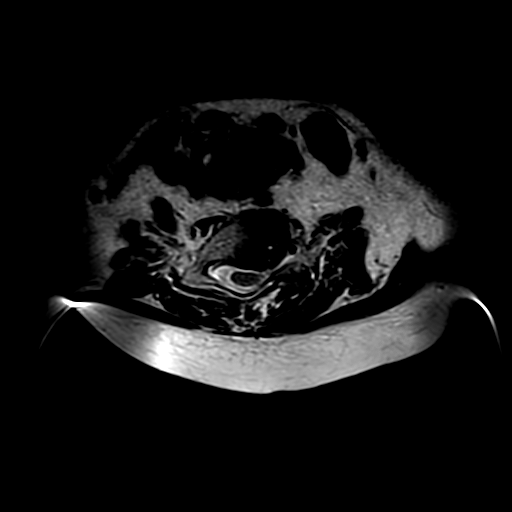
[im 12/23]
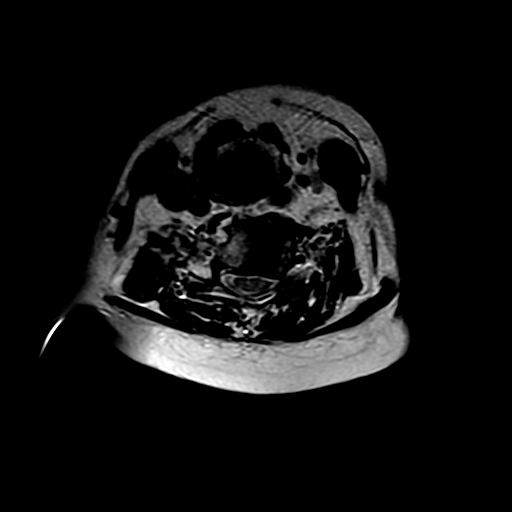
[im 14/23]
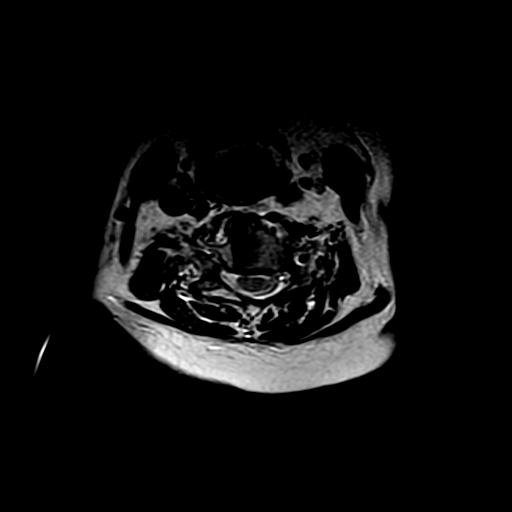
[im 16/23]
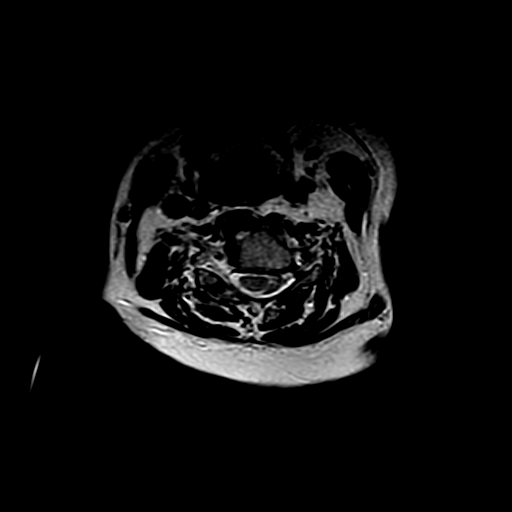
[im 20/23]
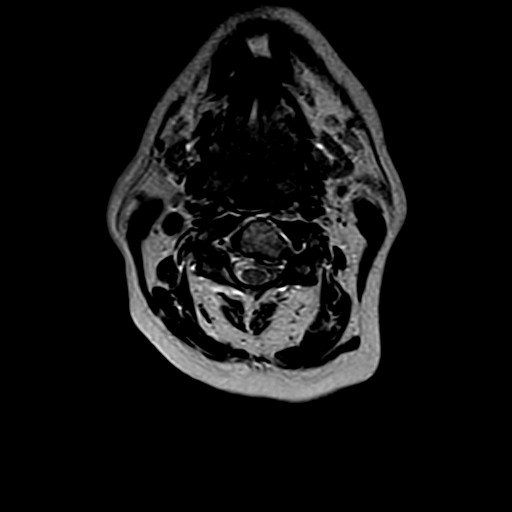

[25 of 48 positions shown; findings below may reference images not displayed]

FINDINGS: Partial congenital fusion of C7 and T1 vertebral bodies are noted. 

Structures of the foramina magnum are normal in the sagittal view. 

At C2-3 level, degenerative disc osteophyte complex causing mild compromise of left lateral recess. 

At C3-4 level, degenerative disc disease with asymmetric bulging annulus and osteophyte complex is causing severe compromise of the left neural foramina.

At C4-5 level, severe degenerative disc disease is noted with first-degree spondylolisthesis of C4 on C5 which is also seen on CT examination of 05/20/2022.  The degenerative changes and spondylolisthesis are causing significant compromise of right lateral recess and right neural foramina at this level. Mild compromise of thecal sac in the midline is noted.

At C5-6 level, degenerative disc disease with prominent osteophyte complex on the left side is causing significant left foraminal narrowing.  Mild compromise of thecal sac with AP diameter in the midline measuring 7.1 mm.

At C6-7 level, degenerative disc disease with disc osteophyte complex causing significant left foraminal narrowing.  Significant compromise of left lateral recess is also noted at this level.

At T1-2 level, no focal disc lesions are seen. 

No definite evidence of cervical cord myelopathy is seen. No evidence of cord edema or syrinx cavity are noted.
IMPRESSION: 1.  Significant deformity of cervical spine with partial congenital fusion of C7 and T1 vertebrae.

2. At C3-4 level, degenerative disc disease with asymmetric bulging annulus and osteophyte complex is causing severe compromise of the left neural foramina.

3. At C4-5 level, severe degenerative disc disease is noted with first-degree spondylolisthesis of C4 on C5 which is also seen on CT examination of 05/20/2022.  The degenerative changes and spondylolisthesis are causing significant compromise of right lateral recess and right neural foramina at this level. Mild compromise of thecal sac in the midline is noted.

4. At C5-6 level, degenerative disc disease with prominent osteophyte complex on the left side is causing significant left foraminal narrowing.  Mild compromise of thecal sac with AP diameter in the midline measuring 7.1 mm.

5. At C6-7 level, degenerative disc disease with disc osteophyte complex causing significant left foraminal narrowing.  Significant compromise of left lateral recess is also noted at this level 

6. No evidence of cervical cord edema or myelopathy is seen. No evidence of syrinx cavity is noted.
